# Patient Record
Sex: Male | Born: 1950 | Race: White | Hispanic: No | Marital: Single
Health system: Southern US, Community
[De-identification: ages and names within clinical notes are randomized; demographics above are authoritative.]

---

## 2000-05-11 ENCOUNTER — Encounter: Payer: Self-pay | Admitting: Family Medicine

## 2000-05-11 ENCOUNTER — Ambulatory Visit (HOSPITAL_COMMUNITY): Admission: RE | Admit: 2000-05-11 | Discharge: 2000-05-11 | Payer: Self-pay | Admitting: Family Medicine

## 2001-09-11 ENCOUNTER — Encounter: Admission: RE | Admit: 2001-09-11 | Discharge: 2001-11-01 | Payer: Self-pay | Admitting: Orthopedic Surgery

## 2002-01-15 ENCOUNTER — Encounter: Admission: RE | Admit: 2002-01-15 | Discharge: 2002-04-02 | Payer: Self-pay | Admitting: Orthopedic Surgery

## 2003-08-11 ENCOUNTER — Ambulatory Visit (HOSPITAL_COMMUNITY): Admission: RE | Admit: 2003-08-11 | Discharge: 2003-08-11 | Payer: Self-pay | Admitting: Gastroenterology

## 2006-06-05 ENCOUNTER — Ambulatory Visit: Payer: Self-pay | Admitting: Cardiology

## 2006-06-23 ENCOUNTER — Ambulatory Visit: Payer: Self-pay

## 2010-03-11 ENCOUNTER — Encounter
Admission: RE | Admit: 2010-03-11 | Discharge: 2010-03-11 | Payer: Self-pay | Source: Home / Self Care | Attending: Orthopedic Surgery | Admitting: Orthopedic Surgery

## 2010-03-12 ENCOUNTER — Encounter: Admit: 2010-03-12 | Payer: Self-pay | Admitting: Family Medicine

## 2010-07-16 NOTE — Op Note (Signed)
NAME:  Benjamin Stevens, Benjamin Stevens                       ACCOUNT NO.:  [id number]   MEDICAL RECORD NO.:  [id number]                   PATIENT TYPE:  AMB   LOCATION:  ENDO                                 FACILITY:  [facility]   PHYSICIAN:  Danise Edge, M.D.                DATE OF BIRTH:  08/05/1950   DATE OF PROCEDURE:  08/11/2003  DATE OF DISCHARGE:                                 OPERATIVE REPORT   PROCEDURE:  Screening colonoscopy.   INDICATIONS FOR PROCEDURE:  Mr. Delyle Weider is a 60 year old male born  23-Apr-1950.  Mr. Heskett is scheduled to undergo his first screening  colonoscopy with polypectomy to prevent colon cancer.   ENDOSCOPIST:  Danise Edge, M.D.   PREMEDICATION:  Versed 7.5 mg, Demerol 60 mg.   DESCRIPTION OF PROCEDURE:  After obtaining informed consent, Mr. Conant was  placed in the left lateral decubitus position. I administered intravenous  Demerol and intravenous Versed to achieve conscious sedation for the  procedure. The patient's blood pressure, oxygen saturation and cardiac  rhythm were monitored throughout the procedure and documented in the medical  record.   Anal inspection and digital rectal exam were normal. The prostate was small  and non-nodular.  The Olympus pediatric colonoscope was introduced into the  rectum and advanced to the cecum. Colonic preparation for the exam today was  excellent.   RECTUM:  Normal.   SIGMOID COLON AND DESCENDING COLON:  Normal.   SPLENIC FLEXURE:  Normal.   TRANSVERSE COLON:  Normal.   HEPATIC FLEXURE:  Normal.   ASCENDING COLON:  Normal.   CECUM AND ILEOCECAL VALVE:  Normal.   ASSESSMENT:  Normal screening proctocolonoscopy to the cecum.                                               Danise Edge, M.D.    MJ/MEDQ  D:  08/11/2003  T:  08/11/2003  Job:  [id number]   cc:   Leanne Chang, M.D.  [address]  [place]  [place] [address]  Fax: [phone]

## 2010-07-16 NOTE — Assessment & Plan Note (Signed)
[facility]                            CARDIOLOGY OFFICE NOTE   HAVIER, DEEB                    MRN:          [id number]  DATE:06/05/2006                            DOB:          [birth year]    I was asked by Dr. Herb Grays to evaluate Benjamin Stevens, a delightful 60-  year-old gentleman, with chest pain.   His cardiac risk factors include a family history with his father having  an MI in his 21s and his brother having one even earlier than that.  Unfortunately, both smoked.   He describes this as a chest ache that usually occurs substernally or  left of the sternum. It is not typically exertional.   He denies any other symptoms with that such as shortness of breath,  diaphoresis or nausea. It does not radiate.   He had an exercise treadmill several years ago which was negative. Dr.  Collins Scotland was concerned that he might need a stress nuclear study.   PAST MEDICAL HISTORY:   MEDICATIONS:  He is currently on enteric-coated aspirin 81 mg a day.   ALLERGIES:  He has no known drug allergies.   He does not drink. He does not smoke. He uses no illicit drugs. Had a  colonoscopy in 2005, which was normal. He currently has a bad cold. He  took a flu shot this year.   FAMILY HISTORY:  Significant for his brother who had bypass in his 17s  and his father had coronary artery disease in his 41s. There is also a  history of hypertension.   REVIEW OF SYSTEMS:  All systems are reviewed and are negative.   EXAMINATION:  He is very pleasant. His blood pressure is 140/79. His  pulse is 96 and regular. His weight is 159 pounds.  HEENT: Normocephalic, atraumatic. PERRLA.  Facial symmetry is normal. He  wears glasses.  Dentition is satisfactory. Carotid upstrokes are equal  bilaterally without bruits. No JVD.  NECK: Supple. Thyroid is not enlarged. Trachea is midline.  LUNGS:  Are clear.  HEART: Reveals a regular rate and rhythm. No gallop, rub, or  murmur.  ABDOMEN: Soft with good bowel sounds. No midline bruit. There is no  hepatomegaly.  EXTREMITIES: Reveals no cyanosis, clubbing or edema. Pulses are intact.  NEURO: Intact.   ASSESSMENT/PLAN:  Benjamin Stevens has non-cardiac chest pain. However, he has  a very strong family history. It has been several years since he has had  any stress or functional assessment for coronary disease.   PLAN:  Exercise rest/stress Myoview to rule out any obstructive coronary  disease.     Thomas C. Daleen Squibb, MD, [facility]  Electronically Signed    TCW/MedQ  DD: 06/05/2006  DT: 06/05/2006  Job #: [id number]   cc:   Tammy R. Collins Scotland, M.D.

## 2011-07-03 IMAGING — CR DG ORBITS FOR FOREIGN BODY
2 series · 2 of 2 positions shown · non-contrast
Comparison: Report from pre MRI orbits on 05/11/2000 and MRI
05/11/2000.

CLINICAL DATA: Preop metal exposure.

ORBITS FOR FOREIGN BODY - 2 VIEW

[view not recorded (1 of 2)]
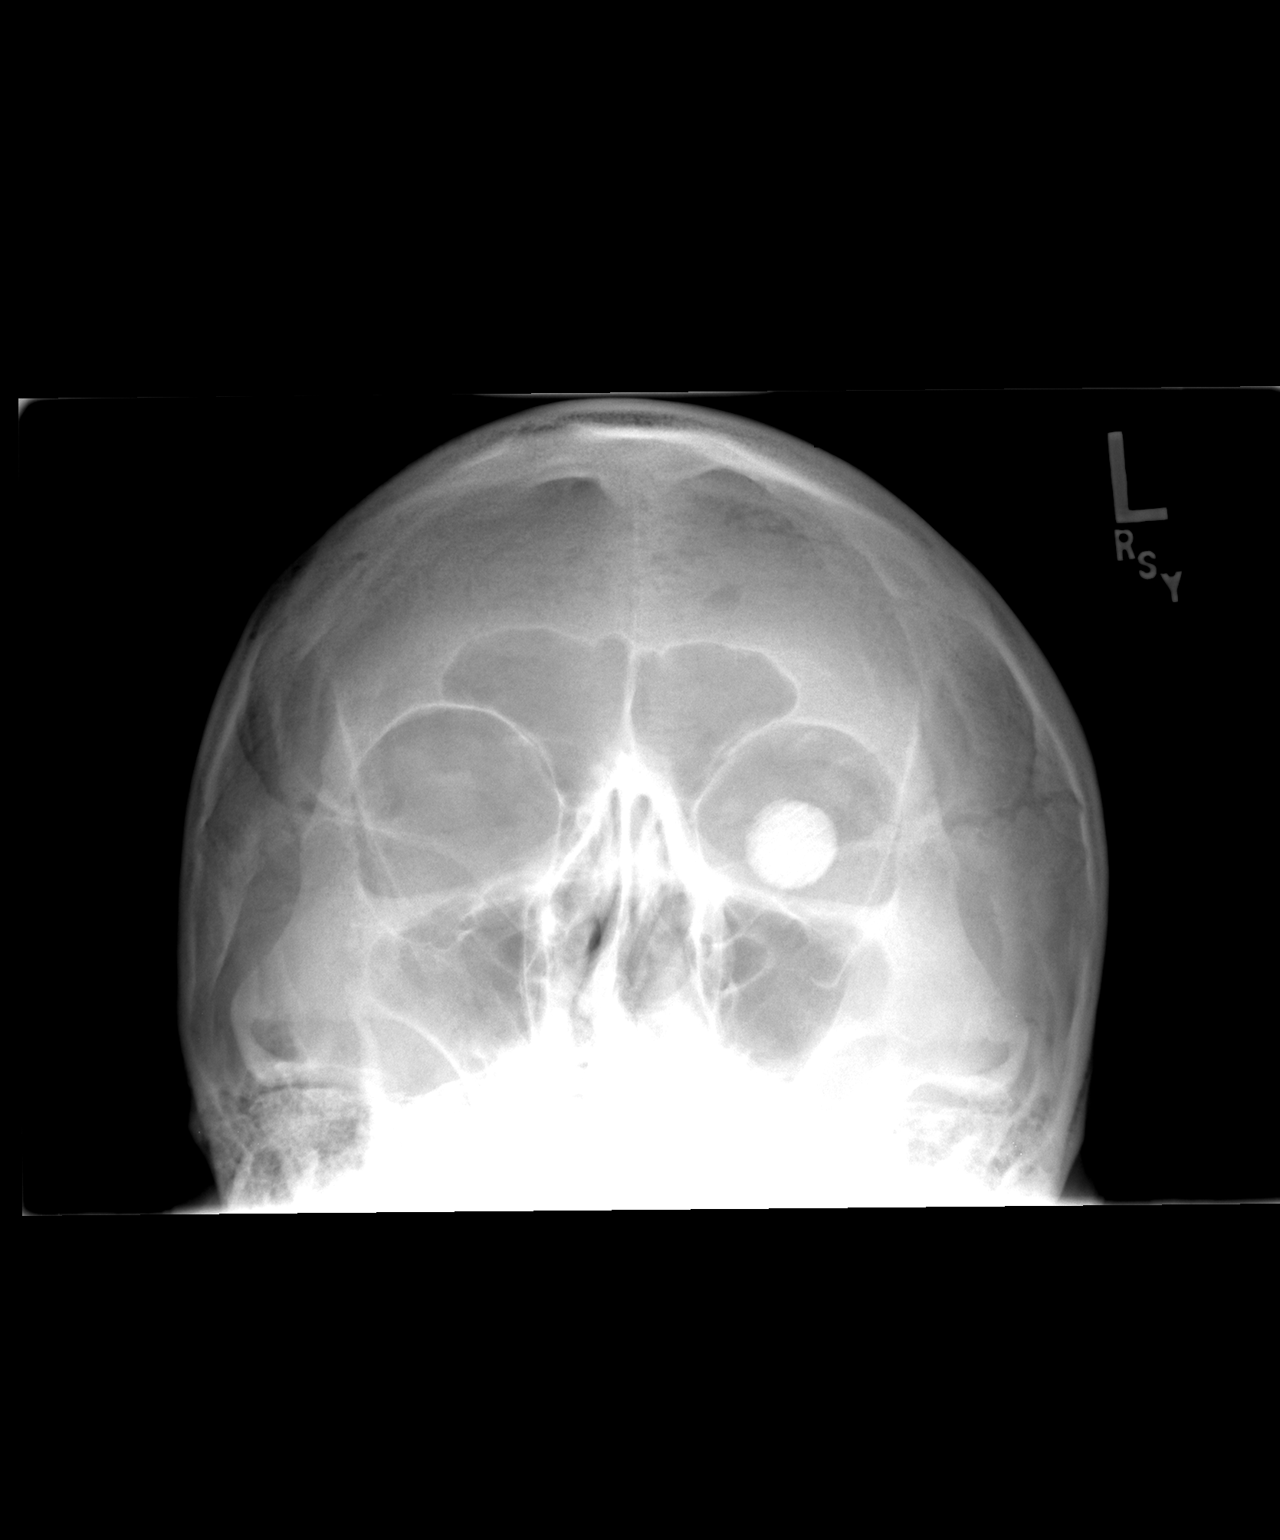

[view not recorded (2 of 2)]
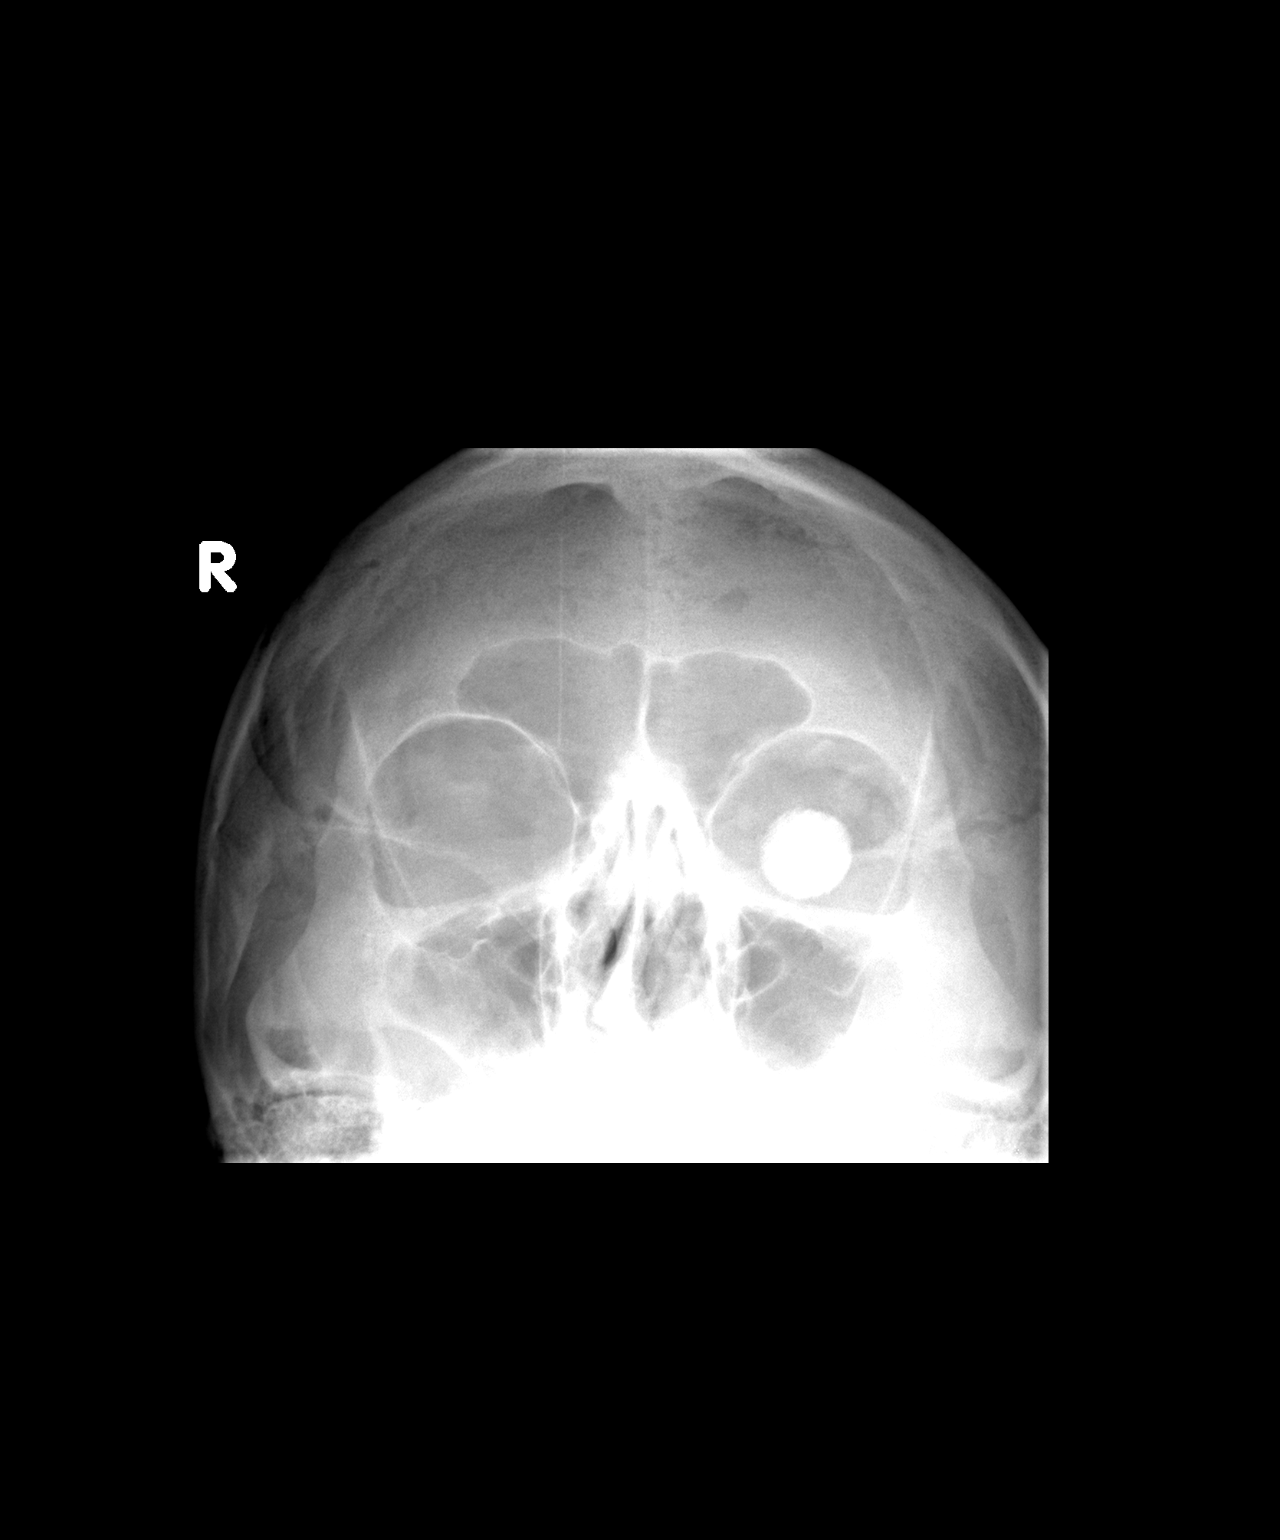

[2 of 2 positions shown; findings below may reference images not displayed]

FINDINGS: There is a left orbital prosthesis in place.  This was
mentioned on prior study.  The patient reports no interval surgery
between 2222 and now.  The patient was scanned in 2222 and had no
problems.  I see no other metallic foreign bodies projecting over
the orbits.
IMPRESSION: Left orbital prosthesis.  No other metallic foreign bodies.  The
patient has had previous MRI with this prosthesis and had no
problems.

## 2013-07-29 ENCOUNTER — Other Ambulatory Visit: Payer: Self-pay | Admitting: Urology

## 2013-07-29 DIAGNOSIS — N400 Enlarged prostate without lower urinary tract symptoms: Secondary | ICD-10-CM

## 2013-08-12 ENCOUNTER — Ambulatory Visit
Admission: RE | Admit: 2013-08-12 | Discharge: 2013-08-12 | Disposition: A | Discharge status: Home / Self Care | Payer: BC Managed Care – HMO | Source: Ambulatory Visit | Attending: Urology | Admitting: Urology

## 2013-08-12 DIAGNOSIS — N400 Enlarged prostate without lower urinary tract symptoms: Secondary | ICD-10-CM
